# Patient Record
Sex: Male | Born: 1960 | Hispanic: Yes | Marital: Married | Smoking: Never smoker
Health system: Southern US, Community
[De-identification: ages and names within clinical notes are randomized; demographics above are authoritative.]

## PROBLEM LIST (undated history)

## (undated) DIAGNOSIS — K5792 Diverticulitis of intestine, part unspecified, without perforation or abscess without bleeding: Secondary | ICD-10-CM

## (undated) DIAGNOSIS — E7800 Pure hypercholesterolemia, unspecified: Secondary | ICD-10-CM

## (undated) DIAGNOSIS — I10 Essential (primary) hypertension: Secondary | ICD-10-CM

---

## 2005-01-20 ENCOUNTER — Emergency Department: Payer: Self-pay | Admitting: Emergency Medicine

## 2012-10-17 ENCOUNTER — Emergency Department: Payer: Self-pay | Admitting: Emergency Medicine

## 2013-11-04 ENCOUNTER — Emergency Department: Payer: Self-pay | Admitting: Emergency Medicine

## 2013-11-04 LAB — COMPREHENSIVE METABOLIC PANEL
ALK PHOS: 91 U/L
Albumin: 3.9 g/dL (ref 3.4–5.0)
Anion Gap: 2 — ABNORMAL LOW (ref 7–16)
BUN: 10 mg/dL (ref 7–18)
Bilirubin,Total: 0.5 mg/dL (ref 0.2–1.0)
Calcium, Total: 9 mg/dL (ref 8.5–10.1)
Chloride: 105 mmol/L (ref 98–107)
Co2: 28 mmol/L (ref 21–32)
Creatinine: 1.13 mg/dL (ref 0.60–1.30)
Glucose: 112 mg/dL — ABNORMAL HIGH (ref 65–99)
Osmolality: 270 (ref 275–301)
POTASSIUM: 3.8 mmol/L (ref 3.5–5.1)
SGOT(AST): 33 U/L (ref 15–37)
SGPT (ALT): 53 U/L (ref 12–78)
SODIUM: 135 mmol/L — AB (ref 136–145)
Total Protein: 7.9 g/dL (ref 6.4–8.2)

## 2013-11-04 LAB — CBC WITH DIFFERENTIAL/PLATELET
BASOS PCT: 0.3 %
Basophil #: 0 10*3/uL (ref 0.0–0.1)
Eosinophil #: 0.1 10*3/uL (ref 0.0–0.7)
Eosinophil %: 0.9 %
HCT: 47.8 % (ref 40.0–52.0)
HGB: 16.3 g/dL (ref 13.0–18.0)
LYMPHS ABS: 1.6 10*3/uL (ref 1.0–3.6)
Lymphocyte %: 11.8 %
MCH: 29 pg (ref 26.0–34.0)
MCHC: 34.1 g/dL (ref 32.0–36.0)
MCV: 85 fL (ref 80–100)
MONO ABS: 0.9 x10 3/mm (ref 0.2–1.0)
Monocyte %: 6.9 %
Neutrophil #: 11.1 10*3/uL — ABNORMAL HIGH (ref 1.4–6.5)
Neutrophil %: 80.1 %
Platelet: 204 10*3/uL (ref 150–440)
RBC: 5.61 10*6/uL (ref 4.40–5.90)
RDW: 13.4 % (ref 11.5–14.5)
WBC: 13.8 10*3/uL — AB (ref 3.8–10.6)

## 2013-11-04 LAB — URINALYSIS, COMPLETE
Bacteria: NONE SEEN
Bilirubin,UR: NEGATIVE
Blood: NEGATIVE
GLUCOSE, UR: NEGATIVE mg/dL (ref 0–75)
Ketone: NEGATIVE
Leukocyte Esterase: NEGATIVE
Nitrite: NEGATIVE
PH: 6 (ref 4.5–8.0)
Protein: NEGATIVE
RBC,UR: <1 /HPF (ref 0–5)
SPECIFIC GRAVITY: 1.012 (ref 1.003–1.030)
SQUAMOUS EPITHELIAL: NONE SEEN
WBC UR: <1 /HPF (ref 0–5)

## 2013-11-07 ENCOUNTER — Inpatient Hospital Stay: Payer: Self-pay | Admitting: Surgery

## 2013-11-07 LAB — CBC
HCT: 48.8 % (ref 40.0–52.0)
HGB: 16.1 g/dL (ref 13.0–18.0)
MCH: 28.1 pg (ref 26.0–34.0)
MCHC: 33 g/dL (ref 32.0–36.0)
MCV: 85 fL (ref 80–100)
Platelet: 212 10*3/uL (ref 150–440)
RBC: 5.73 10*6/uL (ref 4.40–5.90)
RDW: 13.3 % (ref 11.5–14.5)
WBC: 10.4 10*3/uL (ref 3.8–10.6)

## 2013-11-07 LAB — URINALYSIS, COMPLETE
Bacteria: NONE SEEN
Bilirubin,UR: NEGATIVE
Blood: NEGATIVE
Glucose,UR: NEGATIVE mg/dL (ref 0–75)
Ketone: NEGATIVE
Leukocyte Esterase: NEGATIVE
Nitrite: NEGATIVE
PH: 7 (ref 4.5–8.0)
Protein: NEGATIVE
RBC,UR: 1 /HPF (ref 0–5)
SPECIFIC GRAVITY: 1.012 (ref 1.003–1.030)
Squamous Epithelial: NONE SEEN
WBC UR: <1 /HPF (ref 0–5)

## 2013-11-07 LAB — COMPREHENSIVE METABOLIC PANEL
ALBUMIN: 3.6 g/dL (ref 3.4–5.0)
ANION GAP: 8 (ref 7–16)
Alkaline Phosphatase: 81 U/L
BUN: 9 mg/dL (ref 7–18)
Bilirubin,Total: 0.8 mg/dL (ref 0.2–1.0)
CREATININE: 0.82 mg/dL (ref 0.60–1.30)
Calcium, Total: 9.1 mg/dL (ref 8.5–10.1)
Chloride: 106 mmol/L (ref 98–107)
Co2: 24 mmol/L (ref 21–32)
EGFR (African American): >60
Glucose: 94 mg/dL (ref 65–99)
OSMOLALITY: 274 (ref 275–301)
POTASSIUM: 4.1 mmol/L (ref 3.5–5.1)
SGOT(AST): 39 U/L — ABNORMAL HIGH (ref 15–37)
SGPT (ALT): 45 U/L (ref 12–78)
SODIUM: 138 mmol/L (ref 136–145)
Total Protein: 8.5 g/dL — ABNORMAL HIGH (ref 6.4–8.2)

## 2013-11-07 LAB — LIPASE, BLOOD: Lipase: 104 U/L (ref 73–393)

## 2013-11-08 LAB — CBC WITH DIFFERENTIAL/PLATELET
BASOS PCT: 0.4 %
Basophil #: 0 10*3/uL (ref 0.0–0.1)
EOS ABS: 0.1 10*3/uL (ref 0.0–0.7)
EOS PCT: 1.3 %
HCT: 43.3 % (ref 40.0–52.0)
HGB: 14.6 g/dL (ref 13.0–18.0)
LYMPHS PCT: 25.1 %
Lymphocyte #: 2 10*3/uL (ref 1.0–3.6)
MCH: 28.7 pg (ref 26.0–34.0)
MCHC: 33.6 g/dL (ref 32.0–36.0)
MCV: 85 fL (ref 80–100)
MONO ABS: 0.6 x10 3/mm (ref 0.2–1.0)
MONOS PCT: 7.8 %
NEUTROS PCT: 65.4 %
Neutrophil #: 5.2 10*3/uL (ref 1.4–6.5)
Platelet: 221 10*3/uL (ref 150–440)
RBC: 5.07 10*6/uL (ref 4.40–5.90)
RDW: 13.1 % (ref 11.5–14.5)
WBC: 7.9 10*3/uL (ref 3.8–10.6)

## 2013-11-08 LAB — BASIC METABOLIC PANEL
Anion Gap: 8 (ref 7–16)
BUN: 9 mg/dL (ref 7–18)
CALCIUM: 8.8 mg/dL (ref 8.5–10.1)
CO2: 26 mmol/L (ref 21–32)
CREATININE: 1.09 mg/dL (ref 0.60–1.30)
Chloride: 105 mmol/L (ref 98–107)
EGFR (Non-African Amer.): >60
GLUCOSE: 126 mg/dL — AB (ref 65–99)
OSMOLALITY: 278 (ref 275–301)
POTASSIUM: 3.7 mmol/L (ref 3.5–5.1)
SODIUM: 139 mmol/L (ref 136–145)

## 2014-08-12 NOTE — Consult Note (Signed)
PATIENT NAME:  Jacob Atkins, Jacob Atkins MR#:  [id number] DATE OF BIRTH:  Aug 29, 1960  DATE OF CONSULTATION:  11/09/2013  REFERRING PHYSICIAN:  Dr. Michela PitcherEly. CONSULTING PHYSICIAN:  Jacob Atkins. Jacob Matzek, MD  REASON FOR CONSULTATION: Hypertension.   HISTORY OF PRESENTING ILLNESS: A 54 year old male patient with history of hypertension, not on medications, admitted to the hospital with acute diverticulitis. The patient was initially diagnosed in the Emergency Room on 11/05/2013, with sigmoid diverticulitis. He was started on oral antibiotics and discharged home, but returned because of worsening pain and fever.  Patient was admitted and is doing better at this time on oral antibiotics, and is presently on full liquid diet. His blood pressure has been significantly elevated with the highest being 195/93. The patient mentions that he was diagnosed in the past with hypertension in the Emergency Room during one of his visits last year, but he has not been compliant with the medications.   PAST MEDICAL HISTORY:  Hypertension, not on medications.   SOCIAL HISTORY: The patient does not smoke, very rare alcohol use. Works as a Corporate investment bankerconstruction worker. No illicit drug use.   CODE STATUS: FULL CODE.   FAMILY HISTORY: Mother and father are in [place] and have no medical problems.   ALLERGIES: NO KNOWN DRUG ALLERGIES.   HOME MEDICATIONS: Tylenol p.Atkins.n.   REVIEW OF SYSTEMS: CONSTITUTIONAL: No fever, fatigue, weakness.  EYES: No blurred vision, pain, or redness.  EAR, NOSE, AND THROAT: No tinnitus, ear pain, hearing loss.  RESPIRATORY: No cough, wheezing, hemoptysis.  CARDIOVASCULAR: No chest pain, orthopnea, edema. GASTROINTESTINAL: No nausea, vomiting, diarrhea. Does have left lower quadrant pain. No melena, hematochezia, hematemesis.  GENITOURINARY: No dysuria, hematuria, frequency.  ENDOCRINE: No polyuria, nocturia, thyroid problems.  HEMATOLOGIC AND LYMPHATIC: No anemia, easy bruising, bleeding.  INTEGUMENTARY: No  acne, rash, lesion.  MUSCULOSKELETAL: No back pain, arthritis.  NEUROLOGIC: No focal numbness, weakness, seizure.  PSYCHIATRIC: No anxiety or depression.   PHYSICAL EXAMINATION:  VITAL SIGNS: Temperature 98.3, pulse of 56, respirations 18, blood pressure 173/92, saturating 99% on room air.  GENERAL: Obese Hispanic male patient lying in bed, seems comfortable, conversational, cooperative with exam.  PSYCHIATRIC: Alert and oriented x 3. Mood and affect appropriate. Judgment intact.  HEENT: Atraumatic, normocephalic. Oral mucosa moist and pink. External ears and nose normal. No pallor. No icterus. Pupils bilaterally equal and reactive to light.  NECK: Supple. No thyromegaly or palpable lymph nodes. Trachea midline. No carotid bruit or JVD.  CARDIOVASCULAR: S1, S2, without any murmurs, peripheral pulses 2+. No edema.  RESPIRATORY: Normal work of breathing. Clear to auscultation on both sides.   GASTROINTESTINAL: Soft abdomen. Tenderness in the left lower quadrant on deep palpation. No rigidity or guarding. Bowel sounds present. No hepatosplenomegaly palpable.  SKIN: Warm and dry. No petechiae, does seem to have a small area of bruising on the left lower extremity anteriorly over the shin.  MUSCULOSKELETAL: No joint swelling or redness in large joints. Normal muscle tone.  NEUROLOGICAL: Motor strength 5/5 in upper and lower extremities. Sensation  intact all over.   LYMPHATIC: No cervical lymphadenopathy.   LABORATORY STUDIES: Glucose of 126, BUN 9, creatinine 1.09, sodium 139, potassium 3.7, chloride 105, bicarbonate 26, GFR greater than 60. AST, ALT, alkaline phosphatase and bilirubin normal. WBC 7.9, hemoglobin 14.6, platelets of 221,000.   Urinalysis has no glucose, no protein.   CT scan of the abdomen done recently on 11/05/2013, has been reviewed, shows acute sigmoid diverticulitis.   ASSESSMENT AND PLAN:  1.  Hypertension, uncontrolled.  The patient was diagnosed with hypertension in 2014,  at a ER visit, so was started on lisinopril/hydrochlorothiazide, but has not been compliant with medications. At this point, we will start him on a combination lisinopril/hydrochlorothiazide 20/12.5 once a day. Also, a baby aspirin 81 mg daily. I have discussed with the patient that his blood pressure medication is on the [employer] 4 dollar list. I have also discussed with him regarding long-term complications of uncontrolled hypertension. He needs follow-up in 1-2 weeks for his high blood pressure. If his blood pressure is still uncontrolled, this medication can be made b.i.d. from once a day. We will also add hydralazine IV p.Atkins.n.   2.  Acute diverticulitis, improving with IV antibiotics on full liquid diet.   3.  Elevated fasting blood sugar. The patient has had mildly elevated fasting blood sugars here in the hospital at 126, but he is on D5, which is the cause. There is no glucose in his urinalysis or protein.   4.  Deep vein thrombosis prophylaxis. He is on Lovenox.   Thank you for the consult.   TIME SPENT TODAY ON THIS CONSULT:  40 minutes.   ____________________________ Jacob Bailiff Intisar Claudio, MD srs:ts D: 11/09/2013 11:03:00 ET T: 11/09/2013 11:45:24 ET JOB#: [id number]  cc: Jacob Atkins. Jacob Anis, MD, <Dictator> Quentin Ore III, MD Orie Fisherman MD ELECTRONICALLY SIGNED 11/09/2013 17:24

## 2014-08-12 NOTE — Discharge Summary (Signed)
PATIENT NAME:  Jacob Atkins, Lewin R MR#:  [id number] DATE OF BIRTH:  March 13, 1961  DATE OF ADMISSION:  11/07/2013 DATE OF DISCHARGE:  11/10/2013  BRIEF HISTORY: [name] is a 54 year old gentleman with a history of diverticulitis, previously seen on July 17 in the Emergency Room with what appeared to be sigmoid diverticulitis and possible microperforation. CT scan made the diagnosis. Slightly elevated white blood cell count. He was placed on p.o. antibiotics and sent home. His symptoms began to worsen over the weekend and he was admitted on July 20 with continued abdominal pain, elevated white blood cell count, for failure of outpatient therapy. He was begun on IV antibiotics. He was noted to be significantly hypertensive while in the hospital, and does have a history of untreated hypertension. He was seen by the internal medical service, which assisted in the management of his hypertension and suggested outpatient therapy options. His symptoms improved. He was able to tolerate a regular diet.   He was discharged home on July 23, to be followed in the office in 7 to 10 days' time. Bathing, activity, and driving instructions were given to the patient.   He is to resume his activity level, but not return to work until he follows up in the office.   DISCHARGE MEDICATIONS: Include Keflex 500 mg q.8 h., Cleocin 300 mg q.8 h. He is also discharged home on Percocet 5/325 every 4-6 hours p.r.n.   FINAL DISCHARGE DIAGNOSIS: Perforated diverticulitis.   SURGERY: None.   ____________________________ Carmie Endalph L. Ely III, MD rle:jr D: 11/11/2013 12:20:37 ET T: 11/11/2013 13:35:25 ET JOB#: [id number]  cc: Quentin Orealph L. Ely III, MD, <Dictator> Quentin OreALPH L ELY MD ELECTRONICALLY SIGNED 11/12/2013 18:36

## 2014-08-12 NOTE — H&P (Signed)
PATIENT NAME:  Jacob Atkins, Jacob Atkins MR#:  [id number] DATE OF BIRTH:  July 15, 1960  DATE OF ADMISSION:  11/07/2013  PRIMARY CARE PHYSICIAN: None.   CHIEF COMPLAINT: Abdominal pain.   BRIEF HISTORY: Mr. Jacob Atkins is a 54 year old gentleman seen in the Emergency Room with increasing left lower quadrant abdominal pain. He was evaluated on Friday July 17 in the Emergency Room with abdominal pain, elevated white blood cell count. He had symptoms for a couple of days prior to evaluation and his symptoms had increased. Emergency Room evaluation revealed an elevated white blood cell count. CT scan demonstrated what appeared to be sigmoid diverticulitis with a possible microperforation. He was placed on p.o. antibiotics and was discharged home to follow up in the office this week. Over the weekend he has had increasing abdominal pain, fever and chills, anorexia, diarrhea and increasing pain. He presented back to the Emergency Room this morning.  His white blood cell count is back toward normal. However, with his increasing abdominal symptoms, the surgical service was consulted.   He denies any previous similar symptoms. He has no history of hepatitis, yellow jaundice, pancreatitis, peptic ulcer disease, gallbladder disease or previous diagnosis of diverticulitis. Denies any previous abdominal surgery. He has no history of cardiac disease, diabetes or thyroid problems. He does have a history of hypertension, currently untreated. He is not a cigarette smoker. He does not drink alcohol regularly.   REVIEW OF SYSTEMS:  Completely unremarkable other than the current symptoms noted.   SOCIAL HISTORY:  He is employed.   PHYSICAL EXAMINATION:  VITAL SIGNS:  Blood pressure 174/92, afebrile, heart rate 76 and regular.  HEENT:  Unremarkable. No scleral icterus, no pupillary abnormalities.  NECK:  Supple, nontender with midline trachea. No adenopathy.   CHEST:  Clear with no adventitious sounds. He has normal pulmonary  excursion.  CARDIAC:  No murmurs or gallops to my ear. He seems to be in normal sinus rhythm.  ABDOMEN:  Soft with the exception of the left lower quadrant where he has point tenderness, guarding with no rebound. I cannot palpate a mass in the left lower quadrant.  EXTREMITIES: Lower extremity exam reveals full range of motion, no deformities. Good distal pulses.  PSYCHIATRIC: Normal orientation, normal affect. He appears to have a good understanding of English and his wife assists in the interpretation.   IMPRESSION:  I have independently reviewed his previous CT scan. He does appear to have sigmoid diverticulitis. It is impossible to tell from the films whether he has a microperforation. Because clinically he is worse but has not had any increase in his abdominal findings to suggest the need for surgery we will not repeat his CT scan at this point. We will place him on IV antibiotics and follow his clinical examination. This plan has been discussed with the patient and his wife and they are in agreement.    ____________________________ Carmie Endalph L. Ely III, MD rle:lt D: 11/07/2013 14:46:01 ET T: 11/07/2013 15:35:22 ET JOB#: [id number]  cc: Quentin Orealph L. Ely III, MD, <Dictator> Quentin OreALPH L ELY MD ELECTRONICALLY SIGNED 11/11/2013 7:17

## 2016-08-08 ENCOUNTER — Inpatient Hospital Stay: Payer: Self-pay

## 2016-08-08 ENCOUNTER — Emergency Department: Payer: Self-pay

## 2016-08-08 ENCOUNTER — Inpatient Hospital Stay
Admission: EM | Admit: 2016-08-08 | Discharge: 2016-08-09 | DRG: 153 | Disposition: A | Discharge status: Home / Self Care | Payer: Self-pay | Attending: Internal Medicine | Admitting: Internal Medicine

## 2016-08-08 ENCOUNTER — Encounter: Payer: Self-pay | Admitting: Emergency Medicine

## 2016-08-08 DIAGNOSIS — E785 Hyperlipidemia, unspecified: Secondary | ICD-10-CM

## 2016-08-08 DIAGNOSIS — R739 Hyperglycemia, unspecified: Secondary | ICD-10-CM

## 2016-08-08 DIAGNOSIS — Z72820 Sleep deprivation: Secondary | ICD-10-CM

## 2016-08-08 DIAGNOSIS — J302 Other seasonal allergic rhinitis: Secondary | ICD-10-CM | POA: Diagnosis present

## 2016-08-08 DIAGNOSIS — I639 Cerebral infarction, unspecified: Secondary | ICD-10-CM | POA: Diagnosis present

## 2016-08-08 DIAGNOSIS — I10 Essential (primary) hypertension: Secondary | ICD-10-CM

## 2016-08-08 DIAGNOSIS — Z7982 Long term (current) use of aspirin: Secondary | ICD-10-CM

## 2016-08-08 DIAGNOSIS — Z79899 Other long term (current) drug therapy: Secondary | ICD-10-CM

## 2016-08-08 DIAGNOSIS — R42 Dizziness and giddiness: Secondary | ICD-10-CM

## 2016-08-08 DIAGNOSIS — J019 Acute sinusitis, unspecified: Principal | ICD-10-CM | POA: Diagnosis present

## 2016-08-08 HISTORY — DX: Diverticulitis of intestine, part unspecified, without perforation or abscess without bleeding: K57.92

## 2016-08-08 HISTORY — DX: Pure hypercholesterolemia, unspecified: E78.00

## 2016-08-08 HISTORY — DX: Essential (primary) hypertension: I10

## 2016-08-08 LAB — CBC
HEMATOCRIT: 42 % (ref 40.0–52.0)
Hemoglobin: 14.5 g/dL (ref 13.0–18.0)
MCH: 28.5 pg (ref 26.0–34.0)
MCHC: 34.6 g/dL (ref 32.0–36.0)
MCV: 82.4 fL (ref 80.0–100.0)
PLATELETS: 242 10*3/uL (ref 150–440)
RBC: 5.09 MIL/uL (ref 4.40–5.90)
RDW: 12.9 % (ref 11.5–14.5)
WBC: 10.2 10*3/uL (ref 3.8–10.6)

## 2016-08-08 LAB — TROPONIN I
Troponin I: <0.03 ng/mL (ref <0.03)
Troponin I: <0.03 ng/mL (ref <0.03)

## 2016-08-08 LAB — COMPREHENSIVE METABOLIC PANEL
ALT: 31 U/L (ref 17–63)
ANION GAP: 9 (ref 5–15)
AST: 29 U/L (ref 15–41)
Albumin: 4.7 g/dL (ref 3.5–5.0)
Alkaline Phosphatase: 65 U/L (ref 38–126)
BUN: 22 mg/dL — ABNORMAL HIGH (ref 6–20)
CO2: 25 mmol/L (ref 22–32)
Calcium: 9.5 mg/dL (ref 8.9–10.3)
Chloride: 100 mmol/L — ABNORMAL LOW (ref 101–111)
Creatinine, Ser: 0.96 mg/dL (ref 0.61–1.24)
Glucose, Bld: 145 mg/dL — ABNORMAL HIGH (ref 65–99)
Potassium: 3.7 mmol/L (ref 3.5–5.1)
SODIUM: 134 mmol/L — AB (ref 135–145)
TOTAL PROTEIN: 8.2 g/dL — AB (ref 6.5–8.1)
Total Bilirubin: 0.7 mg/dL (ref 0.3–1.2)

## 2016-08-08 LAB — URINE DRUG SCREEN, QUALITATIVE (ARMC ONLY)
AMPHETAMINES, UR SCREEN: NOT DETECTED
Barbiturates, Ur Screen: NOT DETECTED
Benzodiazepine, Ur Scrn: NOT DETECTED
CANNABINOID 50 NG, UR ~~LOC~~: NOT DETECTED
COCAINE METABOLITE, UR ~~LOC~~: NOT DETECTED
MDMA (ECSTASY) UR SCREEN: NOT DETECTED
METHADONE SCREEN, URINE: NOT DETECTED
Opiate, Ur Screen: NOT DETECTED
Phencyclidine (PCP) Ur S: NOT DETECTED
TRICYCLIC, UR SCREEN: NOT DETECTED

## 2016-08-08 LAB — URINALYSIS, COMPLETE (UACMP) WITH MICROSCOPIC
BACTERIA UA: NONE SEEN
BILIRUBIN URINE: NEGATIVE
GLUCOSE, UA: NEGATIVE mg/dL
HGB URINE DIPSTICK: NEGATIVE
Ketones, ur: 5 mg/dL — AB
LEUKOCYTES UA: NEGATIVE
NITRITE: NEGATIVE
Protein, ur: NEGATIVE mg/dL
SPECIFIC GRAVITY, URINE: 1.02 (ref 1.005–1.030)
SQUAMOUS EPITHELIAL / LPF: NONE SEEN
pH: 6 (ref 5.0–8.0)

## 2016-08-08 LAB — TSH: TSH: 0.612 u[IU]/mL (ref 0.350–4.500)

## 2016-08-08 LAB — PROTIME-INR
INR: 0.94
Prothrombin Time: 12.6 seconds (ref 11.4–15.2)

## 2016-08-08 LAB — APTT: aPTT: 29 seconds (ref 24–36)

## 2016-08-08 LAB — LIPASE, BLOOD: Lipase: 27 U/L (ref 11–51)

## 2016-08-08 MED ORDER — SODIUM CHLORIDE 0.9% FLUSH
3.0000 mL | Freq: Two times a day (BID) | INTRAVENOUS | Status: DC
  Administered 2016-08-08 – 2016-08-09 (×3): 3 mL via INTRAVENOUS

## 2016-08-08 MED ORDER — IOPAMIDOL (ISOVUE-370) INJECTION 76%
75.0000 mL | Freq: Once | INTRAVENOUS | Status: AC | PRN
  Administered 2016-08-08: 75 mL via INTRAVENOUS

## 2016-08-08 MED ORDER — MECLIZINE HCL 25 MG PO TABS: 25.0000 mg | ORAL_TABLET | Freq: Three times a day (TID) | ORAL | Status: DC | PRN

## 2016-08-08 MED ORDER — ONDANSETRON HCL 4 MG/2ML IJ SOLN: 4.0000 mg | Freq: Four times a day (QID) | INTRAMUSCULAR | Status: DC | PRN

## 2016-08-08 MED ORDER — POTASSIUM CHLORIDE IN NACL 20-0.9 MEQ/L-% IV SOLN
INTRAVENOUS | Status: DC
  Administered 2016-08-08 – 2016-08-09 (×2): via INTRAVENOUS
  Filled 2016-08-08 (×4): qty 1000

## 2016-08-08 MED ORDER — ONDANSETRON HCL 4 MG/2ML IJ SOLN
INTRAMUSCULAR | Status: AC
  Administered 2016-08-08: 4 mg via INTRAVENOUS
  Filled 2016-08-08: qty 2

## 2016-08-08 MED ORDER — SODIUM CHLORIDE 0.9 % IV SOLN
1000.0000 mL | Freq: Once | INTRAVENOUS | Status: AC
  Administered 2016-08-08: 1000 mL via INTRAVENOUS

## 2016-08-08 MED ORDER — ENOXAPARIN SODIUM 40 MG/0.4ML ~~LOC~~ SOLN
40.0000 mg | SUBCUTANEOUS | Status: DC
  Administered 2016-08-08: 21:00:00 40 mg via SUBCUTANEOUS
  Filled 2016-08-08: qty 0.4

## 2016-08-08 MED ORDER — ATORVASTATIN CALCIUM 20 MG PO TABS
40.0000 mg | ORAL_TABLET | Freq: Every day | ORAL | Status: DC
  Administered 2016-08-08: 17:00:00 40 mg via ORAL
  Filled 2016-08-08: qty 2

## 2016-08-08 MED ORDER — ONDANSETRON HCL 4 MG PO TABS: 4.0000 mg | ORAL_TABLET | Freq: Four times a day (QID) | ORAL | Status: DC | PRN

## 2016-08-08 MED ORDER — ONDANSETRON HCL 4 MG/2ML IJ SOLN
4.0000 mg | Freq: Once | INTRAMUSCULAR | Status: AC
  Administered 2016-08-08: 4 mg via INTRAVENOUS

## 2016-08-08 MED ORDER — ASPIRIN EC 325 MG PO TBEC: 325.0000 mg | DELAYED_RELEASE_TABLET | Freq: Every day | ORAL | Status: DC

## 2016-08-08 MED ORDER — ASPIRIN 81 MG PO CHEW
324.0000 mg | CHEWABLE_TABLET | Freq: Once | ORAL | Status: AC
  Administered 2016-08-08: 324 mg via ORAL
  Filled 2016-08-08: qty 4

## 2016-08-08 NOTE — ED Triage Notes (Signed)
Pt reports vomiting and dizziness that began at 0100 today. Denies diarrhea. Denies pain.

## 2016-08-08 NOTE — ED Notes (Signed)
[name], ED tech called to transport pt to the floor

## 2016-08-08 NOTE — ED Notes (Signed)
Pt in MRI.

## 2016-08-08 NOTE — ED Notes (Signed)
Pt denies headache at this time to RN. States that he did have a headache this morning when he started throwing up. Pt states that he has hx/o severe headaches

## 2016-08-08 NOTE — Progress Notes (Signed)
Patient's MRI is negative for stroke.  Dr. Winona Legato on the unit and verbal order by Dr. Winona Legato was given to discontinue neuro checks and q 2 hr vitals.  Orson Ape, RN

## 2016-08-08 NOTE — ED Notes (Signed)
Pt unable to give urine sample at this time 

## 2016-08-08 NOTE — H&P (Addendum)
[facility] [place] at [facility]   PATIENT NAME: Jacob Atkins    MR#:  [id number]  DATE OF BIRTH:  01/10/1961  DATE OF ADMISSION:  08/08/2016  PRIMARY CARE PHYSICIAN: [facility]   REQUESTING/REFERRING PHYSICIAN:   CHIEF COMPLAINT:   Chief Complaint  Patient presents with  . Emesis  . Dizziness    HISTORY OF PRESENT ILLNESS: Jacob Atkins  is a 56 y.o. male with a known history of  Hypertension, hyperlipidemia, who presents to ER with complaints of dizziness, some headache, nausea and vomiting, near syncope, CT of head showed small focus in Left centrum semiovale, concerning for acute stroke. The patient denies any swallowing, speech, visual problems, no numbness or weakness in  arms or legs. Hospitalist was consulted for admission.      PAST MEDICAL HISTORY:   Past Medical History:  Diagnosis Date  . Diverticulitis   . Elevated cholesterol   . Hypertension     PAST SURGICAL HISTORY: History reviewed. No pertinent surgical history.  SOCIAL HISTORY:  Social History  Substance Use Topics  . Smoking status: Never Smoker  . Smokeless tobacco: Never Used  . Alcohol use No    FAMILY HISTORY: patient's parents live in [place] and have no medical problems. Marland Kitchen  DRUG ALLERGIES: No Known Allergies  Review of Systems  Eyes: Positive for blurred vision.  Respiratory: Positive for cough and wheezing.   Cardiovascular: Positive for palpitations.  Gastrointestinal: Positive for nausea and vomiting.  Neurological: Positive for dizziness.    MEDICATIONS AT HOME:  Prior to Admission medications   Medication Sig Start Date End Date Taking? Authorizing Provider  lisinopril-hydrochlorothiazide (PRINZIDE,ZESTORETIC) 20-12.5 MG tablet Take 1 tablet by mouth daily. 03/09/14  Yes Historical Provider, MD  lovastatin (MEVACOR) 40 MG tablet Take 40 mg by mouth at bedtime.   Yes Historical Provider, MD      PHYSICAL EXAMINATION:    VITAL SIGNS: Blood pressure 129/77, pulse 80, temperature 97.8 F (36.6 C), temperature source Oral, resp. rate 16, height  (1.651 m), weight 86.2 kg (190 lb), SpO2 100 %.  GENERAL:  56 y.o.-year-old patient lying in the bed with no acute distress.  EYES: Pupils equal, round, reactive to light and accommodation. No scleral icterus. Extraocular muscles intact.  HEENT: Head atraumatic, normocephalic. Oropharynx and nasopharynx clear.  NECK:  Supple, no jugular venous distention. No thyroid enlargement, no tenderness.  LUNGS: Normal breath sounds bilaterally, no wheezing, rales,rhonchi or crepitation. No use of accessory muscles of respiration.  CARDIOVASCULAR: S1, S2 normal. No murmurs, rubs, or gallops.  ABDOMEN: Soft, nontender, nondistended. Bowel sounds present. No organomegaly or mass.  EXTREMITIES: No pedal edema, cyanosis, or clubbing.  NEUROLOGIC: Cranial nerves II through XII revealed mild L facial weakness, forehead is spared, no tongue deviation. Muscle strength 5/5 in all extremities, but LLE, some weaker, although patient argues it's not. Sensation grossly  intact. Gait not checked.  PSYCHIATRIC: The patient is alert and oriented x 3.  SKIN: No obvious rash, lesion, or ulcer.   LABORATORY PANEL:   CBC  Recent Labs Lab 08/08/16 0856  WBC 10.2  HGB 14.5  HCT 42.0  PLT 242  MCV 82.4  MCH 28.5  MCHC 34.6  RDW 12.9   ------------------------------------------------------------------------------------------------------------------  Chemistries   Recent Labs Lab 08/08/16 0856  NA 134*  K 3.7  CL 100*  CO2 25  GLUCOSE 145*  BUN 22*  CREATININE 0.96  CALCIUM 9.5  AST 29  ALT 31  ALKPHOS 65  BILITOT 0.7   ------------------------------------------------------------------------------------------------------------------  Cardiac Enzymes  Recent Labs Lab 08/08/16 0856  TROPONINI <0.03    ------------------------------------------------------------------------------------------------------------------  RADIOLOGY: Ct Angio Head W Or Wo Contrast  Result Date: 08/08/2016 CLINICAL DATA:  Concern for posterior circulation stroke. Dizziness. EXAM: CT ANGIOGRAPHY HEAD AND NECK TECHNIQUE: Multidetector CT imaging of the head and neck was performed using the standard protocol during bolus administration of intravenous contrast. Multiplanar CT image reconstructions and MIPs were obtained to evaluate the vascular anatomy. Carotid stenosis measurements (when applicable) are obtained utilizing NASCET criteria, using the distal internal carotid diameter as the denominator. CONTRAST:  75 mL Isovue 370 IV COMPARISON:  CT head 08/08/2016 FINDINGS: CTA NECK FINDINGS Aortic arch: Normal aortic arch.  Proximal great vessels normal. Right carotid system: Normal right carotid. Negative for atherosclerotic disease or dissection. Left carotid system: Normal left carotid. Negative for atherosclerotic disease or dissection. Vertebral arteries: Both vertebral arteries are normal and widely patent Skeleton: Negative Other neck: Negative for mass or adenopathy Upper chest: Lung apices clear. Review of the MIP images confirms the above findings CTA HEAD FINDINGS Anterior circulation: Mild atherosclerotic calcification in the cavernous carotid bilaterally without stenosis or aneurysm. Anterior and middle cerebral arteries widely patent and normal. Posterior circulation: Both vertebral arteries are normal and patent to the basilar. Basilar widely patent. PICA, superior cerebellar, and posterior cerebral arteries are normal. Venous sinuses: Normal venous enhancement. Anatomic variants: Negative Delayed phase: Normal enhancement on delayed imaging. No change in hypodensity left frontal white matter. Review of the MIP images confirms the above findings IMPRESSION: Negative CTA head and neck. No significant extracranial or  intracranial stenosis. Left frontal white matter hypodensity may represent infarct of indeterminate age. MRI suggested. Electronically Signed   By: Marlan Palau M.D.   On: 08/08/2016 11:23   Ct Head Wo Contrast  Result Date: 08/08/2016 CLINICAL DATA:  Dizziness for several hours EXAM: CT HEAD WITHOUT CONTRAST TECHNIQUE: Contiguous axial images were obtained from the base of the skull through the vertex without intravenous contrast. COMPARISON:  None. FINDINGS: Brain: No findings to suggest acute hemorrhage are identified. In the deep centrum semi ovale on the left there is a rounded area decreased attenuation best seen on image number 20 of series to suspicious for an area of acute to subacute ischemia. Vascular: No hyperdense vessel or unexpected calcification. Skull: Normal. Negative for fracture or focal lesion. Sinuses/Orbits: Mild mucosal changes are noted within the ethmoid and maxillary sinuses. Some fullness in the nasal passages is noted which may be related to polyposis. Other: None. IMPRESSION: Rounded area decreased attenuation in the deep white matter on the left as described suspicious for acute/ subacute ischemia. MRI may be helpful for further evaluation. Mucosal changes in the sinuses of uncertain chronicity. Changes suggestive of nasal polyps. Critical Value/emergent results were called by telephone at the time of interpretation on 08/08/2016 at 9:34 am to Dr. Jene Every , who verbally acknowledged these results. Electronically Signed   By: Alcide Clever M.D.   On: 08/08/2016 09:32   Ct Angio Neck W And/or Wo Contrast  Result Date: 08/08/2016 CLINICAL DATA:  Concern for posterior circulation stroke. Dizziness. EXAM: CT ANGIOGRAPHY HEAD AND NECK TECHNIQUE: Multidetector CT imaging of the head and neck was performed using the standard protocol during bolus administration of intravenous contrast. Multiplanar CT image reconstructions and MIPs were obtained to evaluate the vascular anatomy.  Carotid stenosis measurements (when applicable) are obtained utilizing NASCET criteria, using the distal internal carotid diameter  as the denominator. CONTRAST:  75 mL Isovue 370 IV COMPARISON:  CT head 08/08/2016 FINDINGS: CTA NECK FINDINGS Aortic arch: Normal aortic arch.  Proximal great vessels normal. Right carotid system: Normal right carotid. Negative for atherosclerotic disease or dissection. Left carotid system: Normal left carotid. Negative for atherosclerotic disease or dissection. Vertebral arteries: Both vertebral arteries are normal and widely patent Skeleton: Negative Other neck: Negative for mass or adenopathy Upper chest: Lung apices clear. Review of the MIP images confirms the above findings CTA HEAD FINDINGS Anterior circulation: Mild atherosclerotic calcification in the cavernous carotid bilaterally without stenosis or aneurysm. Anterior and middle cerebral arteries widely patent and normal. Posterior circulation: Both vertebral arteries are normal and patent to the basilar. Basilar widely patent. PICA, superior cerebellar, and posterior cerebral arteries are normal. Venous sinuses: Normal venous enhancement. Anatomic variants: Negative Delayed phase: Normal enhancement on delayed imaging. No change in hypodensity left frontal white matter. Review of the MIP images confirms the above findings IMPRESSION: Negative CTA head and neck. No significant extracranial or intracranial stenosis. Left frontal white matter hypodensity may represent infarct of indeterminate age. MRI suggested. Electronically Signed   By: Marlan Palau M.D.   On: 08/08/2016 11:23    EKG: Orders placed or performed during the hospital encounter of 08/08/16  . ED EKG  . ED EKG  . EKG 12-Lead  . EKG 12-Lead   sinus rhythm at73 bpm , normal axis, no acute STT changes, possible LVH  IMPRESSION AND PLAN:  Active Problems:   Acute CVA (cerebrovascular accident) (HCC)   Dizziness   Hyperglycemia   CVA (cerebral  vascular accident) (HCC)   Essential hypertension   Hyperlipidemia   #1. Acute stroke,, admit patient to the medical floor, initiate him on aspirin, change Mevacor to Lipitor, get brain MRI, echo, neurology consultation,SLP evaluations, Hgb a1c, TSH #2 dizziness, supportive therapy, meclizine prn, PT #3. Hyperglycemia, get Hgb a1c #4 essential HTN, hold BP meds for now #5  hyperlipidemia, lipid panel in am, lipitor  All the records are reviewed and case discussed with ED provider. Management plans discussed with the patient, family and they are in agreement.  CODE STATUS: Code Status History    This patient does not have a recorded code status. Please follow your organizational policy for patients in this situation.       TOTAL TIME TAKING CARE OF THIS PATIENT: 50 minutes.    Katharina Caper M.D on 08/08/2016 at 12:43 PM  Between 7am to 6pm - Pager - [phone] After 6pm go to [email or web address] - password [email or web address] [facility]  [facility]  Office  [phone]  CC: Primary care physician; [facility]

## 2016-08-08 NOTE — Consult Note (Addendum)
Referring Physician: Cyril Loosen    Chief Complaint: Dizziness  HPI: Jacob Atkins is an 56 y.o. male with a history of HTN who presents with acute onset dizziness.  Patient reports that he was at work and was being raised on a platform.  Had acute onset of dizziness.  Describes as the room spinning.  Became nauseous and vomited as well.  Presented for evaluation.  Head CT abnormal.  Initial NIHSS of 0.  Date last known well: Date: 08/08/2016 Time last known well: Time: 01:00 tPA Given: No: Outside time window  Past Medical History:  Diagnosis Date  . Diverticulitis   . Elevated cholesterol   . Hypertension     History reviewed. No pertinent surgical history.  Family history: Both parents deceased. Unclear reasons why and no information about their medical history.   Social History:  reports that he has never smoked. He has never used smokeless tobacco. He reports that he does not drink alcohol. His drug history is not on file.  Allergies: No Known Allergies  Medications: I have reviewed the patient's current medications. Prior to Admission:  Prior to Admission medications   Medication Sig Start Date End Date Taking? Authorizing Provider  lisinopril-hydrochlorothiazide (PRINZIDE,ZESTORETIC) 20-12.5 MG tablet Take 1 tablet by mouth daily. 03/09/14  Yes Historical Provider, MD  lovastatin (MEVACOR) 40 MG tablet Take 40 mg by mouth at bedtime.   Yes Historical Provider, MD    ROS: History obtained from the patient  General ROS: negative for - chills, fatigue, fever, night sweats, weight gain or weight loss Psychological ROS: negative for - behavioral disorder, hallucinations, memory difficulties, mood swings or suicidal ideation Ophthalmic ROS: negative for - blurry vision, double vision, eye pain or loss of vision ENT ROS: negative for - epistaxis, nasal discharge, oral lesions, sore throat, tinnitus Allergy and Immunology ROS: negative for - hives or itchy/watery  eyes Hematological and Lymphatic ROS: negative for - bleeding problems, bruising or swollen lymph nodes Endocrine ROS: negative for - galactorrhea, hair pattern changes, polydipsia/polyuria or temperature intolerance Respiratory ROS: cough Cardiovascular ROS: negative for - chest pain, dyspnea on exertion, edema or irregular heartbeat Gastrointestinal ROS: as noted in HPI Genito-Urinary ROS: negative for - dysuria, hematuria, incontinence or urinary frequency/urgency Musculoskeletal ROS: negative for - joint swelling or muscular weakness Neurological ROS: as noted in HPI Dermatological ROS: negative for rash and skin lesion changes  Physical Examination: Blood pressure 129/77, pulse 80, temperature 97.8 F (36.6 C), temperature source Oral, resp. rate 16, height  (1.651 m), weight 86.2 kg (190 lb), SpO2 100 %.  HEENT-  Normocephalic, no lesions, without obvious abnormality.  Normal external eye and conjunctiva.  Normal TM's bilaterally.  Normal auditory canals and external ears. Normal external nose, mucus membranes and septum.  Normal pharynx. Cardiovascular- S1, S2 normal, pulses palpable throughout   Lungs- chest clear, no wheezing, rales, normal symmetric air entry Abdomen- soft, non-tender; bowel sounds normal; no masses,  no organomegaly Extremities- no edema Lymph-no adenopathy palpable Musculoskeletal-no joint tenderness, deformity or swelling Skin-warm and dry, no hyperpigmentation, vitiligo, or suspicious lesions  Neurological Examination   Mental Status: Alert, oriented, thought content appropriate.  Speech fluent without evidence of aphasia.  Able to follow 3 step commands without difficulty. Cranial Nerves: II: Discs flat bilaterally; Visual fields grossly normal, pupils equal, round, reactive to light and accommodation III,IV, VI: ptosis not present, extra-ocular motions intact bilaterally V,VII: smile symmetric, facial light touch sensation normal bilaterally VIII:  hearing normal bilaterally IX,X: gag reflex  present XI: bilateral shoulder shrug XII: midline tongue extension Motor: Right : Upper extremity   5/5    Left:     Upper extremity   5/5  Lower extremity   5/5     Lower extremity   5/5 Tone and bulk:normal tone throughout; no atrophy noted Sensory: Pinprick and light touch intact throughout, bilaterally Deep Tendon Reflexes: 2+ and symmetric throughout Plantars: Right: mute   Left: mute Cerebellar: Normal finger-to-nose and normal heel-to-shin testing bilaterally Gait: not tested due to safety concerns    Laboratory Studies:  Basic Metabolic Panel:  Recent Labs Lab 08/08/16 0856  NA 134*  K 3.7  CL 100*  CO2 25  GLUCOSE 145*  BUN 22*  CREATININE 0.96  CALCIUM 9.5    Liver Function Tests:  Recent Labs Lab 08/08/16 0856  AST 29  ALT 31  ALKPHOS 65  BILITOT 0.7  PROT 8.2*  ALBUMIN 4.7    Recent Labs Lab 08/08/16 0856  LIPASE 27   No results for input(s): AMMONIA in the last 168 hours.  CBC:  Recent Labs Lab 08/08/16 0856  WBC 10.2  HGB 14.5  HCT 42.0  MCV 82.4  PLT 242    Cardiac Enzymes:  Recent Labs Lab 08/08/16 0856  TROPONINI <0.03    BNP: Invalid input(s): POCBNP  CBG: No results for input(s): GLUCAP in the last 168 hours.  Microbiology: No results found for this or any previous visit.  Coagulation Studies:  Recent Labs  08/08/16 0956  LABPROT 12.6  INR 0.94    Urinalysis:  Recent Labs Lab 08/08/16 0856  COLORURINE YELLOW*  LABSPEC 1.020  PHURINE 6.0  GLUCOSEU NEGATIVE  HGBUR NEGATIVE  BILIRUBINUR NEGATIVE  KETONESUR 5*  PROTEINUR NEGATIVE  NITRITE NEGATIVE  LEUKOCYTESUR NEGATIVE    Lipid Panel: No results found for: CHOL, TRIG, HDL, CHOLHDL, VLDL, LDLCALC  HgbA1C: No results found for: HGBA1C  Urine Drug Screen:     Component Value Date/Time   LABOPIA NONE DETECTED 08/08/2016 0856   COCAINSCRNUR NONE DETECTED 08/08/2016 0856   LABBENZ NONE DETECTED  08/08/2016 0856   AMPHETMU NONE DETECTED 08/08/2016 0856   THCU NONE DETECTED 08/08/2016 0856   LABBARB NONE DETECTED 08/08/2016 0856    Alcohol Level: No results for input(s): ETH in the last 168 hours.  Other results: EKG: sinus rhythm at 73 bpm.  Imaging: Ct Angio Head W Or Wo Contrast  Result Date: 08/08/2016 CLINICAL DATA:  Concern for posterior circulation stroke. Dizziness. EXAM: CT ANGIOGRAPHY HEAD AND NECK TECHNIQUE: Multidetector CT imaging of the head and neck was performed using the standard protocol during bolus administration of intravenous contrast. Multiplanar CT image reconstructions and MIPs were obtained to evaluate the vascular anatomy. Carotid stenosis measurements (when applicable) are obtained utilizing NASCET criteria, using the distal internal carotid diameter as the denominator. CONTRAST:  75 mL Isovue 370 IV COMPARISON:  CT head 08/08/2016 FINDINGS: CTA NECK FINDINGS Aortic arch: Normal aortic arch.  Proximal great vessels normal. Right carotid system: Normal right carotid. Negative for atherosclerotic disease or dissection. Left carotid system: Normal left carotid. Negative for atherosclerotic disease or dissection. Vertebral arteries: Both vertebral arteries are normal and widely patent Skeleton: Negative Other neck: Negative for mass or adenopathy Upper chest: Lung apices clear. Review of the MIP images confirms the above findings CTA HEAD FINDINGS Anterior circulation: Mild atherosclerotic calcification in the cavernous carotid bilaterally without stenosis or aneurysm. Anterior and middle cerebral arteries widely patent and normal. Posterior circulation: Both vertebral arteries are  normal and patent to the basilar. Basilar widely patent. PICA, superior cerebellar, and posterior cerebral arteries are normal. Venous sinuses: Normal venous enhancement. Anatomic variants: Negative Delayed phase: Normal enhancement on delayed imaging. No change in hypodensity left frontal white  matter. Review of the MIP images confirms the above findings IMPRESSION: Negative CTA head and neck. No significant extracranial or intracranial stenosis. Left frontal white matter hypodensity may represent infarct of indeterminate age. MRI suggested. Electronically Signed   By: Marlan Palau M.D.   On: 08/08/2016 11:23   Ct Head Wo Contrast  Result Date: 08/08/2016 CLINICAL DATA:  Dizziness for several hours EXAM: CT HEAD WITHOUT CONTRAST TECHNIQUE: Contiguous axial images were obtained from the base of the skull through the vertex without intravenous contrast. COMPARISON:  None. FINDINGS: Brain: No findings to suggest acute hemorrhage are identified. In the deep centrum semi ovale on the left there is a rounded area decreased attenuation best seen on image number 20 of series to suspicious for an area of acute to subacute ischemia. Vascular: No hyperdense vessel or unexpected calcification. Skull: Normal. Negative for fracture or focal lesion. Sinuses/Orbits: Mild mucosal changes are noted within the ethmoid and maxillary sinuses. Some fullness in the nasal passages is noted which may be related to polyposis. Other: None. IMPRESSION: Rounded area decreased attenuation in the deep white matter on the left as described suspicious for acute/ subacute ischemia. MRI may be helpful for further evaluation. Mucosal changes in the sinuses of uncertain chronicity. Changes suggestive of nasal polyps. Critical Value/emergent results were called by telephone at the time of interpretation on 08/08/2016 at 9:34 am to Dr. Jene Every , who verbally acknowledged these results. Electronically Signed   By: Alcide Clever M.D.   On: 08/08/2016 09:32   Ct Angio Neck W And/or Wo Contrast  Result Date: 08/08/2016 CLINICAL DATA:  Concern for posterior circulation stroke. Dizziness. EXAM: CT ANGIOGRAPHY HEAD AND NECK TECHNIQUE: Multidetector CT imaging of the head and neck was performed using the standard protocol during bolus  administration of intravenous contrast. Multiplanar CT image reconstructions and MIPs were obtained to evaluate the vascular anatomy. Carotid stenosis measurements (when applicable) are obtained utilizing NASCET criteria, using the distal internal carotid diameter as the denominator. CONTRAST:  75 mL Isovue 370 IV COMPARISON:  CT head 08/08/2016 FINDINGS: CTA NECK FINDINGS Aortic arch: Normal aortic arch.  Proximal great vessels normal. Right carotid system: Normal right carotid. Negative for atherosclerotic disease or dissection. Left carotid system: Normal left carotid. Negative for atherosclerotic disease or dissection. Vertebral arteries: Both vertebral arteries are normal and widely patent Skeleton: Negative Other neck: Negative for mass or adenopathy Upper chest: Lung apices clear. Review of the MIP images confirms the above findings CTA HEAD FINDINGS Anterior circulation: Mild atherosclerotic calcification in the cavernous carotid bilaterally without stenosis or aneurysm. Anterior and middle cerebral arteries widely patent and normal. Posterior circulation: Both vertebral arteries are normal and patent to the basilar. Basilar widely patent. PICA, superior cerebellar, and posterior cerebral arteries are normal. Venous sinuses: Normal venous enhancement. Anatomic variants: Negative Delayed phase: Normal enhancement on delayed imaging. No change in hypodensity left frontal white matter. Review of the MIP images confirms the above findings IMPRESSION: Negative CTA head and neck. No significant extracranial or intracranial stenosis. Left frontal white matter hypodensity may represent infarct of indeterminate age. MRI suggested. Electronically Signed   By: Marlan Palau M.D.   On: 08/08/2016 11:23    Assessment: 56 y.o. male with a history of HTN who  presents with acute onset dizziness.  Head CT reviewed and shows an area of hypodensity in the left frontal white matter.  Etiology unclear and unclear if related  to current presentation.  CTA unremarkable.  Further work up recommended.    Stroke Risk Factors - hypertension  Plan: 1. HgbA1c, fasting lipid panel 2. MRI of the brain without contrast.  Would not initiate stroke work up unless indicative of an acute infarct.  At that time would consider, PT consult, OT consult, Speech consult, echocardiogram. 3. Prophylactic therapy-Antiplatelet med: Aspirin - dose  daily 4. Telemetry monitoring 5. Frequent neuro checks 6. Vestibular exercises    Thana Farr, MD Neurology [phone] 08/08/2016, 12:11 PM

## 2016-08-08 NOTE — ED Provider Notes (Signed)
[facility] Emergency Department Provider Note   ____________________________________________    I have reviewed the triage vital signs and the nursing notes.   HISTORY  Chief Complaint Emesis and Dizziness     HPI Jacob Atkins is a 56 y.o. male who presents with complaints of nausea and dizziness. 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 He reports at approximately 5 PM he had eggs for dinner and felt well until approximately 1 AM at which time he became dizzy and nauseated and vomited. He denies abdominal pain. He does report he had a headache while vomiting but that seems to have improved. No fevers or chills. No sick contacts. No recent travel. He has never had this before.   Past Medical History:  Diagnosis Date  . Diverticulitis   . Elevated cholesterol   . Hypertension     There are no active problems to display for this patient.   History reviewed. No pertinent surgical history.  Prior to Admission medications   Medication Sig Start Date End Date Taking? Authorizing Provider  lisinopril-hydrochlorothiazide (PRINZIDE,ZESTORETIC) 20-12.5 MG tablet Take 1 tablet by mouth daily. 03/09/14  Yes Historical Provider, MD  lovastatin (MEVACOR) 40 MG tablet Take 40 mg by mouth at bedtime.   Yes Historical Provider, MD     Allergies Patient has no known allergies.  No family history on file.  Social History Social History  Substance Use Topics  . Smoking status: Never Smoker  . Smokeless tobacco: Never Used  . Alcohol use No    Review of Systems  Constitutional: No fever/chills Eyes: No visual changes.  ENT: No sore throat. Cardiovascular: Denies chest pain. Respiratory: Denies Cough Gastrointestinal: As above Genitourinary: Negative for dysuria. Musculoskeletal: Negative for back pain. Skin: Negative for rash. Neurological: Negative for focal weakness  10-point ROS otherwise  negative.  ____________________________________________   PHYSICAL EXAM:  VITAL SIGNS: ED Triage Vitals  Enc Vitals Group     BP 08/08/16 0830 137/69     Pulse Rate 08/08/16 0830 79     Resp 08/08/16 0830 18     Temp 08/08/16 0830 97.8 F (36.6 C)     Temp Source 08/08/16 0830 Oral     SpO2 08/08/16 0830 100 %     Weight 08/08/16 0831 190 lb (86.2 kg)     Height 08/08/16 0831  (1.651 m)     Head Circumference --      Peak Flow --      Pain Score --      Pain Loc --      Pain Edu? --      Excl. in GC? --     Constitutional: Alert and oriented. No acute distress. Pleasant and interactive Eyes: Conjunctivae are normal. PERRLA, EOMI Head: Atraumatic.  Mouth/Throat: Mucous membranes are moist.   Neck:  Painless ROM Cardiovascular: Normal rate, regular rhythm. Grossly normal heart sounds.  Good peripheral circulation. Respiratory: Normal respiratory effort.  No retractions. Lungs CTAB. Gastrointestinal: Soft and nontender. No distention.  No CVA tenderness. Genitourinary: deferred Musculoskeletal: No lower extremity tenderness nor edema.  Warm and well perfused Neurologic:  Normal speech and language. No gross focal neurologic deficits are appreciated.  Skin:  Skin is warm, dry and intact. No rash noted. Psychiatric: Mood and affect are normal. Speech and behavior are normal.  ____________________________________________   LABS (all labs ordered are listed, but only abnormal results are displayed)  Labs Reviewed  COMPREHENSIVE METABOLIC PANEL - Abnormal; Notable for  the following:       Result Value   Sodium 134 (*)    Chloride 100 (*)    Glucose, Bld 145 (*)    BUN 22 (*)    Total Protein 8.2 (*)    All other components within normal limits  URINALYSIS, COMPLETE (UACMP) WITH MICROSCOPIC - Abnormal; Notable for the following:    Color, Urine YELLOW (*)    APPearance CLEAR (*)    Ketones, ur 5 (*)    All other components within normal limits  LIPASE, BLOOD   CBC  TROPONIN I  APTT  PROTIME-INR  URINE DRUG SCREEN, QUALITATIVE ([facility] ONLY)   ____________________________________________  EKG  ED ECG REPORT I, Jene Every, the attending physician, personally viewed and interpreted this ECG.  Date: 08/08/2016  Rate:73 Rhythm: normal sinus rhythm QRS Axis: normal Intervals: normal ST/T Wave abnormalities: normal Conduction Disturbances: none Narrative Interpretation: unremarkable  ____________________________________________  RADIOLOGY  Called by radiologist re: CT ____________________________________________   PROCEDURES  Procedure(s) performed: No    Critical Care performed: No ____________________________________________   INITIAL IMPRESSION / ASSESSMENT AND PLAN / ED COURSE  Pertinent labs & imaging results that were available during my care of the patient were reviewed by me and considered in my medical decision making (see chart for details).  Patient presents with nausea vomiting and dizziness. He has no abdominal pain. He has not had diarrhea yet. High prevalence of gastroenteritis in the community at this time, we will treat with Zofran and IV fluids as well as send labs obtain CT head and EKG and reevaluate  ----------------------------------------- 9:50 AM on 08/08/2016 -----------------------------------------  Contacted by radiologist and informed of area of decreased attenuation in the deep Centrum semi ovale, recommends MRI.   Discussed with Dr. Thad Ranger of neurology, given symptoms she recommends CTA of the head and neck in the emergency department  ----------------------------------------- 12:01 PM on 08/08/2016 -----------------------------------------  CTA of the head has resulted, I will admit to the hospitalist service for further workup ____________________________________________   FINAL CLINICAL IMPRESSION(S) / ED DIAGNOSES  Final diagnoses:  Cerebrovascular accident (CVA),  unspecified mechanism (HCC)      NEW MEDICATIONS STARTED DURING THIS VISIT:  New Prescriptions   No medications on file     Note:  This document was prepared using Dragon voice recognition software and may include unintentional dictation errors.    Jene Every, MD 08/08/16 1201

## 2016-08-08 NOTE — ED Notes (Signed)
Pt ambulatory to room.

## 2016-08-09 LAB — BASIC METABOLIC PANEL
ANION GAP: 5 (ref 5–15)
BUN: 17 mg/dL (ref 6–20)
CO2: 25 mmol/L (ref 22–32)
Calcium: 8.6 mg/dL — ABNORMAL LOW (ref 8.9–10.3)
Chloride: 108 mmol/L (ref 101–111)
Creatinine, Ser: 0.84 mg/dL (ref 0.61–1.24)
GFR calc Af Amer: >60 mL/min (ref >60)
Glucose, Bld: 113 mg/dL — ABNORMAL HIGH (ref 65–99)
POTASSIUM: 4 mmol/L (ref 3.5–5.1)
SODIUM: 138 mmol/L (ref 135–145)

## 2016-08-09 LAB — CBC
HCT: 39.7 % — ABNORMAL LOW (ref 40.0–52.0)
HEMOGLOBIN: 13.4 g/dL (ref 13.0–18.0)
MCH: 28.2 pg (ref 26.0–34.0)
MCHC: 33.7 g/dL (ref 32.0–36.0)
MCV: 83.5 fL (ref 80.0–100.0)
Platelets: 239 10*3/uL (ref 150–440)
RBC: 4.75 MIL/uL (ref 4.40–5.90)
RDW: 13.3 % (ref 11.5–14.5)
WBC: 7.9 10*3/uL (ref 3.8–10.6)

## 2016-08-09 LAB — LIPID PANEL
CHOL/HDL RATIO: 3.8 ratio
CHOLESTEROL: 134 mg/dL (ref 0–200)
HDL: 35 mg/dL — AB (ref >40)
LDL Cholesterol: 73 mg/dL (ref 0–99)
TRIGLYCERIDES: 129 mg/dL (ref <150)
VLDL: 26 mg/dL (ref 0–40)

## 2016-08-09 LAB — HIV ANTIBODY (ROUTINE TESTING W REFLEX): HIV Screen 4th Generation wRfx: NONREACTIVE

## 2016-08-09 LAB — GLUCOSE, CAPILLARY: Glucose-Capillary: 103 mg/dL — ABNORMAL HIGH (ref 65–99)

## 2016-08-09 LAB — TROPONIN I

## 2016-08-09 LAB — HEMOGLOBIN A1C
Hgb A1c MFr Bld: 5.9 % — ABNORMAL HIGH (ref 4.8–5.6)
MEAN PLASMA GLUCOSE: 123 mg/dL

## 2016-08-09 MED ORDER — ASPIRIN 81 MG PO CHEW
81.0000 mg | CHEWABLE_TABLET | Freq: Every day | ORAL | Status: DC
  Administered 2016-08-09: 09:00:00 81 mg via ORAL
  Filled 2016-08-09: qty 1

## 2016-08-09 MED ORDER — MECLIZINE HCL 25 MG PO TABS: 25.0000 mg | ORAL_TABLET | Freq: Three times a day (TID) | ORAL | Status: DC | PRN

## 2016-08-09 MED ORDER — ASPIRIN 81 MG PO CHEW: 81.0000 mg | CHEWABLE_TABLET | Freq: Every day | ORAL | 0 refills | Status: AC

## 2016-08-09 MED ORDER — LORATADINE 10 MG PO TABS: 10.0000 mg | ORAL_TABLET | Freq: Every day | ORAL | 0 refills | Status: AC

## 2016-08-09 MED ORDER — MECLIZINE HCL 25 MG PO TABS: 25.0000 mg | ORAL_TABLET | Freq: Three times a day (TID) | ORAL | Status: DC

## 2016-08-09 MED ORDER — LORATADINE 10 MG PO TABS
10.0000 mg | ORAL_TABLET | Freq: Every day | ORAL | Status: DC
  Administered 2016-08-09: 09:00:00 10 mg via ORAL
  Filled 2016-08-09: qty 1

## 2016-08-09 NOTE — Progress Notes (Signed)
Pt being discharged home, discharge instructions reviewed with pt and wife, states understanding, pt with no complaints, refuses wheelchair at discharge

## 2016-08-09 NOTE — Progress Notes (Signed)
SLP Cancellation Note  Patient Details Name: Jacob Atkins MRN: [id number] DOB: [birth year]   Cancelled treatment:       Reason Eval/Treat Not Completed: SLP screened, no needs identified, will sign off (chart reviewed; NSG consulted; pt)  NSG to reconsult if any change in status while admitted.   Jerilynn Som, MS, CCC-SLP Watson,Katherine 08/09/2016, 10:04 AM

## 2016-08-09 NOTE — Discharge Instructions (Signed)
Patient advised to follow with Neurology if his symptoms do not improve or keeps recurring

## 2016-08-09 NOTE — Discharge Summary (Signed)
[facility] - [place] at [facility]   PATIENT NAME: Jacob Atkins    MR#:  [id number]  DATE OF BIRTH:  1960/07/27  DATE OF ADMISSION:  08/08/2016 ADMITTING PHYSICIAN: Katharina Caper, MD  DATE OF DISCHARGE: 08/09/16  PRIMARY CARE PHYSICIAN: [facility]    ADMISSION DIAGNOSIS:  Cerebral infarction [facility]) [I63.9] CVA (cerebral infarction) [I63.9] Cerebrovascular accident (CVA), unspecified mechanism (HCC) [I63.9]  DISCHARGE DIAGNOSIS:  Sinusitis with dizziness Seasonal allergies HTN HL  SECONDARY DIAGNOSIS:   Past Medical History:  Diagnosis Date  . Diverticulitis   . Elevated cholesterol   . Hypertension     HOSPITAL COURSE:   Melchor Kirchgessner  is a 56 y.o. male with a known history of  Hypertension, hyperlipidemia, who presents to ER with complaints of dizziness, some headache, nausea and vomiting, near syncope, CT of head showed small focus in Left centrum semiovale, concerning for acute stroke  #1.Dizziness due to sinusitis seasonal allergies and sleep deprivation - on aspirin -cont statins -MRI neg for CVA. Area of white matter changes is nonspecific on MRI likley vascular dz Pt advised to see neurology as out pt if recurrent symptoms -seen by Neurology  #2 dizziness, supportive therapy, meclizine prn  #3 essential HTN Resume  BP meds  #4  hyperlipidemia Cont statins  D/c home spoke with wife CONSULTS OBTAINED:  Treatment Team:  Kym Groom, MD Thana Farr, MD  DRUG ALLERGIES:  No Known Allergies  DISCHARGE MEDICATIONS:   Current Discharge Medication List    START taking these medications   Details  aspirin 81 MG chewable tablet Chew 1 tablet (81 mg total) by mouth daily. Qty: 30 tablet, Refills: 0    loratadine (CLARITIN) 10 MG tablet Take 1 tablet (10 mg total) by mouth daily. Qty: 30 tablet, Refills: 0      CONTINUE these medications which have NOT CHANGED   Details   lisinopril-hydrochlorothiazide (PRINZIDE,ZESTORETIC) 20-12.5 MG tablet Take 1 tablet by mouth daily.    lovastatin (MEVACOR) 40 MG tablet Take 40 mg by mouth at bedtime.        If you experience worsening of your admission symptoms, develop shortness of breath, life threatening emergency, suicidal or homicidal thoughts you must seek medical attention immediately by calling 911 or calling your MD immediately  if symptoms less severe.  You Must read complete instructions/literature along with all the possible adverse reactions/side effects for all the Medicines you take and that have been prescribed to you. Take any new Medicines after you have completely understood and accept all the possible adverse reactions/side effects.   Please note  You were cared for by a hospitalist during your hospital stay. If you have any questions about your discharge medications or the care you received while you were in the hospital after you are discharged, you can call the unit and asked to speak with the hospitalist on call if the hospitalist that took care of you is not available. Once you are discharged, your primary care physician will handle any further medical issues. Please note that NO REFILLS for any discharge medications will be authorized once you are discharged, as it is imperative that you return to your primary care physician (or establish a relationship with a primary care physician if you do not have one) for your aftercare needs so that they can reassess your need for medications and monitor your lab values. Today   SUBJECTIVE   Feels better  VITAL SIGNS:  Blood pressure 116/66, pulse 62, temperature  97.6 F (36.4 C), temperature source Oral, resp. rate 18, height  (1.651 m), weight 83.3 kg (183 lb 11.2 oz), SpO2 97 %.  I/O:   Intake/Output Summary (Last 24 hours) at 08/09/16 0801 Last data filed at 08/08/16 2111  Gross per 24 hour  Intake             1605 ml  Output                 0 ml  Net             1605 ml    PHYSICAL EXAMINATION:  GENERAL:  56 y.o.-year-old patient lying in the bed with no acute distress.  EYES: Pupils equal, round, reactive to light and accommodation. No scleral icterus. Extraocular muscles intact.  HEENT: Head atraumatic, normocephalic. Oropharynx and nasopharynx clear.  NECK:  Supple, no jugular venous distention. No thyroid enlargement, no tenderness.  LUNGS: Normal breath sounds bilaterally, no wheezing, rales,rhonchi or crepitation. No use of accessory muscles of respiration.  CARDIOVASCULAR: S1, S2 normal. No murmurs, rubs, or gallops.  ABDOMEN: Soft, non-tender, non-distended. Bowel sounds present. No organomegaly or mass.  EXTREMITIES: No pedal edema, cyanosis, or clubbing.  NEUROLOGIC: Cranial nerves II through XII are intact. Muscle strength 5/5 in all extremities. Sensation intact. Gait not checked.  PSYCHIATRIC: The patient is alert and oriented x 3.  SKIN: No obvious rash, lesion, or ulcer.   DATA REVIEW:   CBC   Recent Labs Lab 08/09/16 0312  WBC 7.9  HGB 13.4  HCT 39.7*  PLT 239    Chemistries   Recent Labs Lab 08/08/16 0856 08/09/16 0312  NA 134* 138  K 3.7 4.0  CL 100* 108  CO2 25 25  GLUCOSE 145* 113*  BUN 22* 17  CREATININE 0.96 0.84  CALCIUM 9.5 8.6*  AST 29  --   ALT 31  --   ALKPHOS 65  --   BILITOT 0.7  --     Microbiology Results   No results found for this or any previous visit (from the past 240 hour(s)).  RADIOLOGY:  Ct Angio Head W Or Wo Contrast  Result Date: 08/08/2016 CLINICAL DATA:  Concern for posterior circulation stroke. Dizziness. EXAM: CT ANGIOGRAPHY HEAD AND NECK TECHNIQUE: Multidetector CT imaging of the head and neck was performed using the standard protocol during bolus administration of intravenous contrast. Multiplanar CT image reconstructions and MIPs were obtained to evaluate the vascular anatomy. Carotid stenosis measurements (when applicable) are obtained utilizing  NASCET criteria, using the distal internal carotid diameter as the denominator. CONTRAST:  75 mL Isovue 370 IV COMPARISON:  CT head 08/08/2016 FINDINGS: CTA NECK FINDINGS Aortic arch: Normal aortic arch.  Proximal great vessels normal. Right carotid system: Normal right carotid. Negative for atherosclerotic disease or dissection. Left carotid system: Normal left carotid. Negative for atherosclerotic disease or dissection. Vertebral arteries: Both vertebral arteries are normal and widely patent Skeleton: Negative Other neck: Negative for mass or adenopathy Upper chest: Lung apices clear. Review of the MIP images confirms the above findings CTA HEAD FINDINGS Anterior circulation: Mild atherosclerotic calcification in the cavernous carotid bilaterally without stenosis or aneurysm. Anterior and middle cerebral arteries widely patent and normal. Posterior circulation: Both vertebral arteries are normal and patent to the basilar. Basilar widely patent. PICA, superior cerebellar, and posterior cerebral arteries are normal. Venous sinuses: Normal venous enhancement. Anatomic variants: Negative Delayed phase: Normal enhancement on delayed imaging. No change in hypodensity left frontal white matter. Review of  the MIP images confirms the above findings IMPRESSION: Negative CTA head and neck. No significant extracranial or intracranial stenosis. Left frontal white matter hypodensity may represent infarct of indeterminate age. MRI suggested. Electronically Signed   By: Marlan Palau M.D.   On: 08/08/2016 11:23   Ct Head Wo Contrast  Result Date: 08/08/2016 CLINICAL DATA:  Dizziness for several hours EXAM: CT HEAD WITHOUT CONTRAST TECHNIQUE: Contiguous axial images were obtained from the base of the skull through the vertex without intravenous contrast. COMPARISON:  None. FINDINGS: Brain: No findings to suggest acute hemorrhage are identified. In the deep centrum semi ovale on the left there is a rounded area decreased  attenuation best seen on image number 20 of series to suspicious for an area of acute to subacute ischemia. Vascular: No hyperdense vessel or unexpected calcification. Skull: Normal. Negative for fracture or focal lesion. Sinuses/Orbits: Mild mucosal changes are noted within the ethmoid and maxillary sinuses. Some fullness in the nasal passages is noted which may be related to polyposis. Other: None. IMPRESSION: Rounded area decreased attenuation in the deep white matter on the left as described suspicious for acute/ subacute ischemia. MRI may be helpful for further evaluation. Mucosal changes in the sinuses of uncertain chronicity. Changes suggestive of nasal polyps. Critical Value/emergent results were called by telephone at the time of interpretation on 08/08/2016 at 9:34 am to Dr. Jene Every , who verbally acknowledged these results. Electronically Signed   By: Alcide Clever M.D.   On: 08/08/2016 09:32   Ct Angio Neck W And/or Wo Contrast  Result Date: 08/08/2016 CLINICAL DATA:  Concern for posterior circulation stroke. Dizziness. EXAM: CT ANGIOGRAPHY HEAD AND NECK TECHNIQUE: Multidetector CT imaging of the head and neck was performed using the standard protocol during bolus administration of intravenous contrast. Multiplanar CT image reconstructions and MIPs were obtained to evaluate the vascular anatomy. Carotid stenosis measurements (when applicable) are obtained utilizing NASCET criteria, using the distal internal carotid diameter as the denominator. CONTRAST:  75 mL Isovue 370 IV COMPARISON:  CT head 08/08/2016 FINDINGS: CTA NECK FINDINGS Aortic arch: Normal aortic arch.  Proximal great vessels normal. Right carotid system: Normal right carotid. Negative for atherosclerotic disease or dissection. Left carotid system: Normal left carotid. Negative for atherosclerotic disease or dissection. Vertebral arteries: Both vertebral arteries are normal and widely patent Skeleton: Negative Other neck: Negative  for mass or adenopathy Upper chest: Lung apices clear. Review of the MIP images confirms the above findings CTA HEAD FINDINGS Anterior circulation: Mild atherosclerotic calcification in the cavernous carotid bilaterally without stenosis or aneurysm. Anterior and middle cerebral arteries widely patent and normal. Posterior circulation: Both vertebral arteries are normal and patent to the basilar. Basilar widely patent. PICA, superior cerebellar, and posterior cerebral arteries are normal. Venous sinuses: Normal venous enhancement. Anatomic variants: Negative Delayed phase: Normal enhancement on delayed imaging. No change in hypodensity left frontal white matter. Review of the MIP images confirms the above findings IMPRESSION: Negative CTA head and neck. No significant extracranial or intracranial stenosis. Left frontal white matter hypodensity may represent infarct of indeterminate age. MRI suggested. Electronically Signed   By: Marlan Palau M.D.   On: 08/08/2016 11:23   Mr Brain Wo Contrast  Result Date: 08/08/2016 CLINICAL DATA:  Dizziness and headache.  Cerebral infarction EXAM: MRI HEAD WITHOUT CONTRAST TECHNIQUE: Multiplanar, multiecho pulse sequences of the brain and surrounding structures were obtained without intravenous contrast. COMPARISON:  Head CT and CTA from earlier today FINDINGS: Brain: Left frontal white matter low  density on previous CT is the largest of multiple FLAIR signal abnormalities in the bilateral cerebral white matter. These are distributed from the ventricular margin to the juxta cortical white matter. No infratentorial signal abnormalities or discrete lacune. No acute infarct, hemorrhage, or hydrocephalus. No mass effect or extra-axial collection. Brain volume is normal. Partially empty sella, incidental in this clinical setting. Vascular: Recent CTA. Normal flow voids in the dural venous sinuses. Skull and upper cervical spine: Negative Sinuses/Orbits: Mucosal thickening  throughout the paranasal sinuses with trapped secretions and nasal cavity polypoid structures. IMPRESSION: 1. No acute finding, including infarct. 2. Left frontal white matter low density on previous head CT is the largest of multiple white matter signal abnormalities. These are nonspecific but could be from chronic small vessel ischemia given patient's vascular risk factors. Correlate for demyelinating or inflammatory features given patient's young age. 3. Extensive sinusitis with polypoid features in the nasal cavity. Electronically Signed   By: Marnee Spring M.D.   On: 08/08/2016 15:06     Management plans discussed with the patient, family and they are in agreement.  CODE STATUS:     Code Status Orders        Start     Ordered   08/08/16 1507  Full code  Continuous     08/08/16 1506    Code Status History    Date Active Date Inactive Code Status Order ID Comments User Context   This patient has a current code status but no historical code status.      TOTAL TIME TAKING CARE OF THIS PATIENT: *40* minutes.    Amahd Morino M.D on 08/09/2016 at 8:01 AM  Between 7am to 6pm - Pager - [phone] After 6pm go to [email or web address] - Social research officer, government  [facility]  Office  [phone]  CC: Primary care physician; [facility]

## 2018-09-22 IMAGING — MR MR HEAD W/O CM
10 series · 48 of 48 positions shown · non-contrast
Comparison: Head CT and CTA from earlier today

CLINICAL DATA: Dizziness and headache.  Cerebral infarction

EXAM:
MRI HEAD WITHOUT CONTRAST
TECHNIQUE: Multiplanar, multiecho pulse sequences of the brain and surrounding
structures were obtained without intravenous contrast.

[Series 2: T1 · sagittal · 5.0mm · 0.45mm/px · 3 of 25 slices shown (1 of 2)]
[im 1/25]
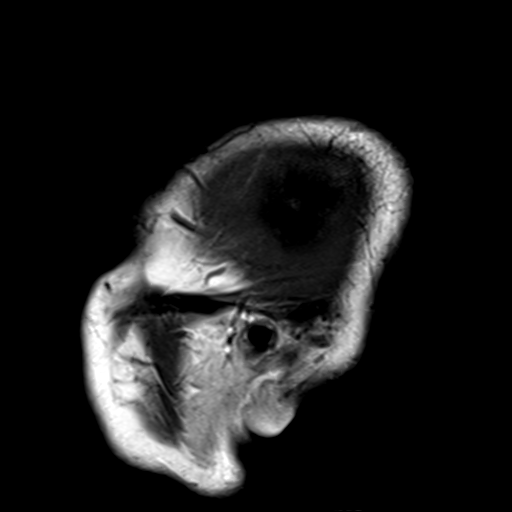
[im 13/25]
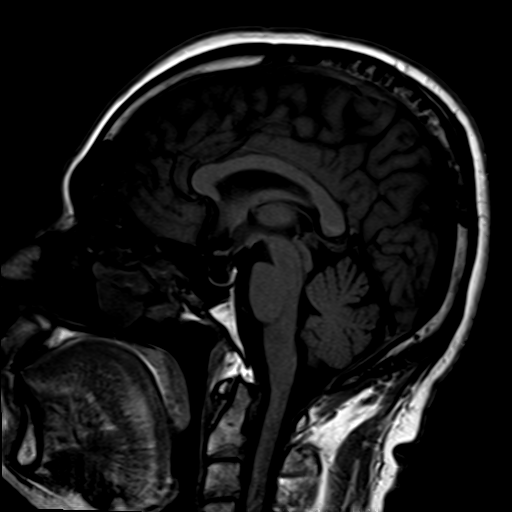
[im 25/25]
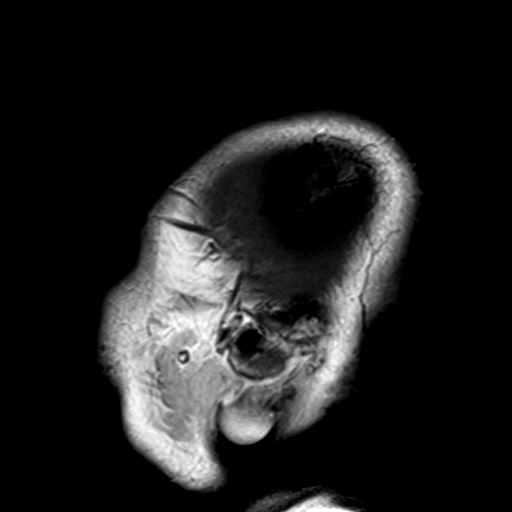

[Series 4: DWI · axial · 3.0mm · 1.80mm/px · z∈[-43,+110]mm · 5 of 53 slices shown (1 of 2)]
[im 1/53]
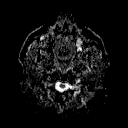
[im 14/53]
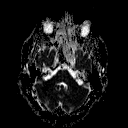
[im 27/53]
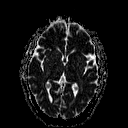
[im 40/53]
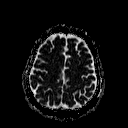
[im 53/53]
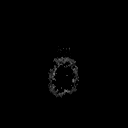

[Series 6: DWI · coronal · 3.0mm · 1.80mm/px · 4 of 45 slices shown (2 of 2)]
[im 1/45]
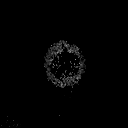
[im 15/45]
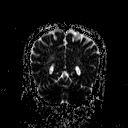
[im 30/45]
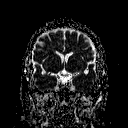
[im 45/45]
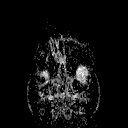

[Series 7: T2 · axial · 5.0mm · 0.60mm/px · z∈[-40,+108]mm · 2 of 25 slices shown (1 of 3)]
[im 1/25]
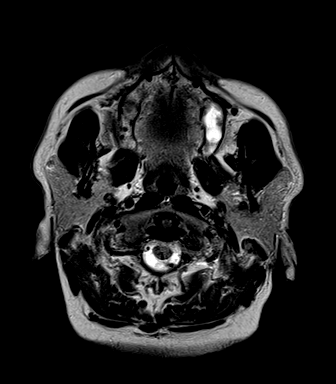
[im 25/25]
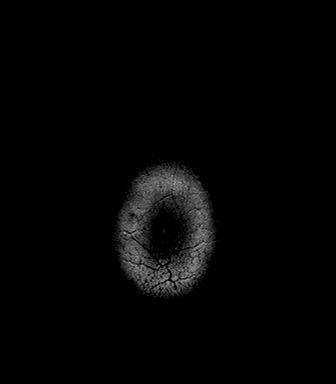

[Series 8: FLAIR · axial · 3.0mm · 0.45mm/px · z∈[-40,+108]mm · 5 of 53 slices shown]
[im 1/53]
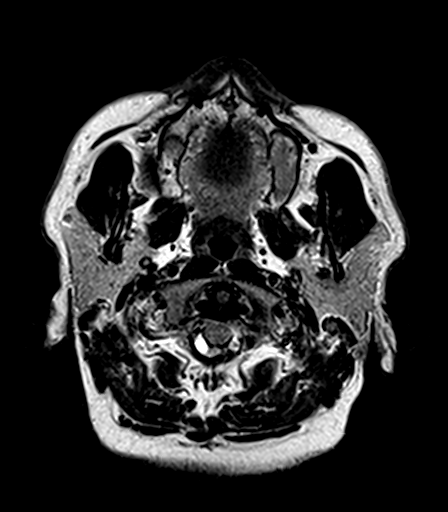
[im 14/53]
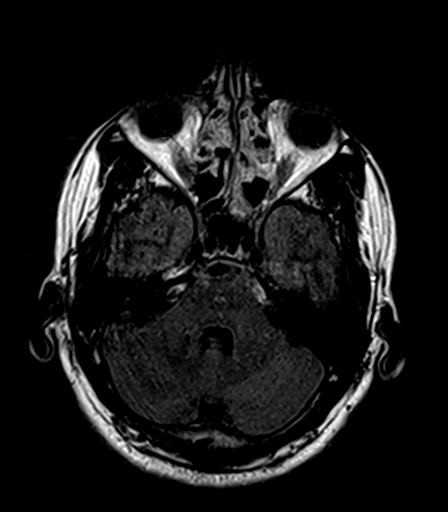
[im 27/53]
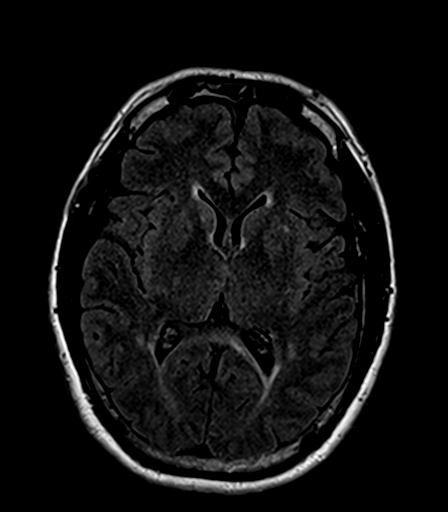
[im 40/53]
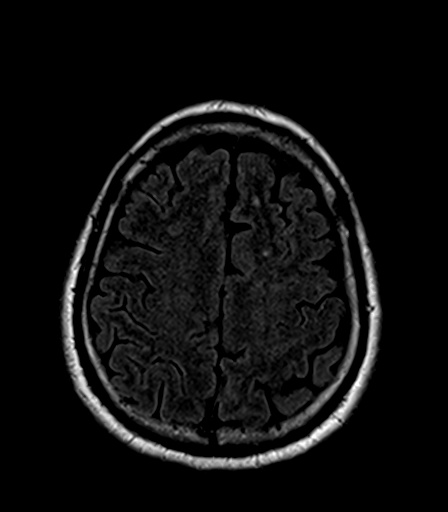
[im 53/53]
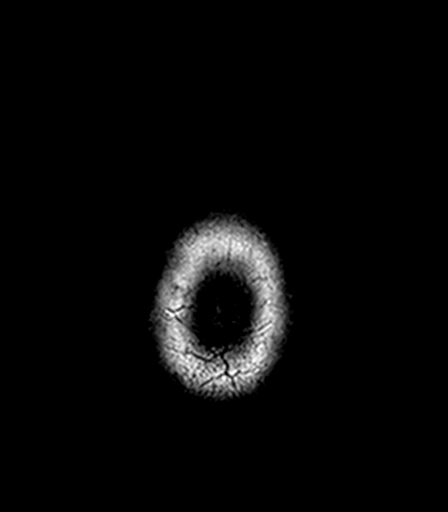

[Series 9: T1 · axial · 1.0mm · 1.00mm/px · z∈[-44,+121]mm · 16 of 176 slices shown (2 of 2)]
[im 1/176]
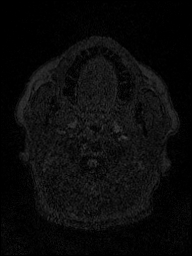
[im 12/176]
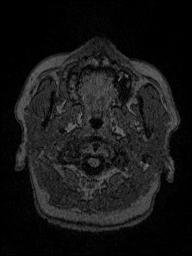
[im 24/176]
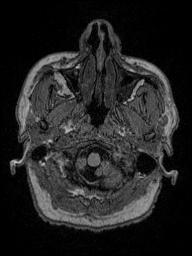
[im 36/176]
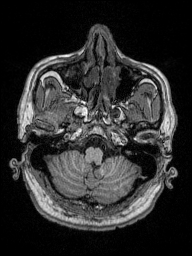
[im 47/176]
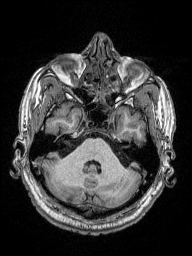
[im 59/176]
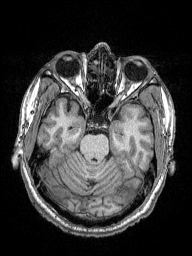
[im 71/176]
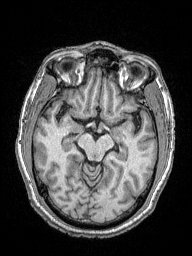
[im 82/176]
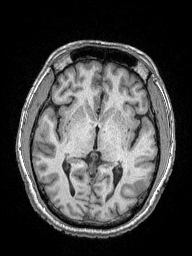
[im 94/176]
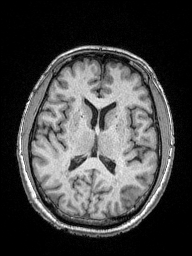
[im 106/176]
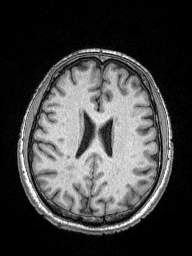
[im 117/176]
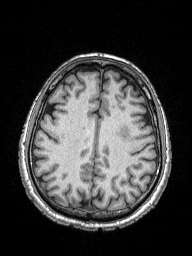
[im 129/176]
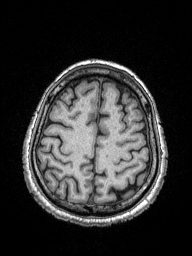
[im 141/176]
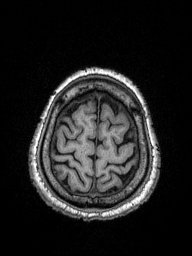
[im 152/176]
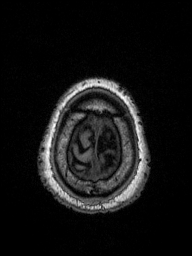
[im 164/176]
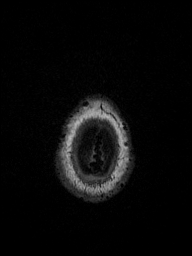
[im 176/176]
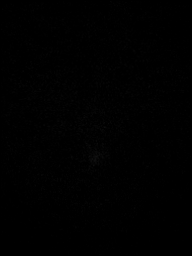

[Series 10: T2 · axial · 5.0mm · 0.45mm/px · z∈[-40,+108]mm · 2 of 25 slices shown (2 of 3)]
[im 1/25]
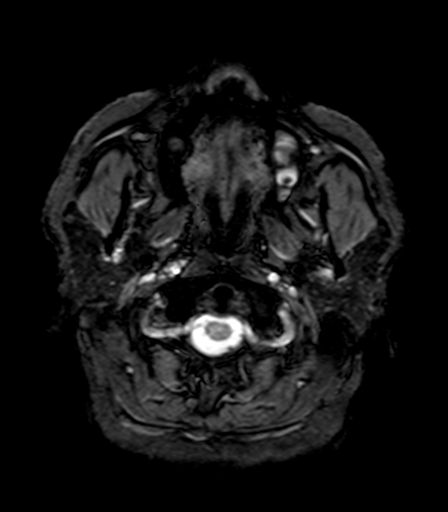
[im 25/25]
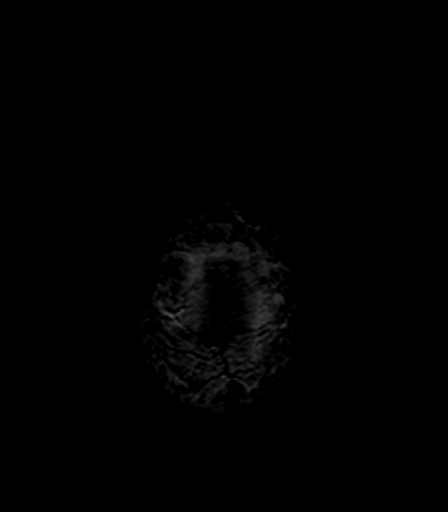

[Series 11: T2 · coronal · 5.0mm · 0.49mm/px · 2 of 27 slices shown (3 of 3)]
[im 1/27]
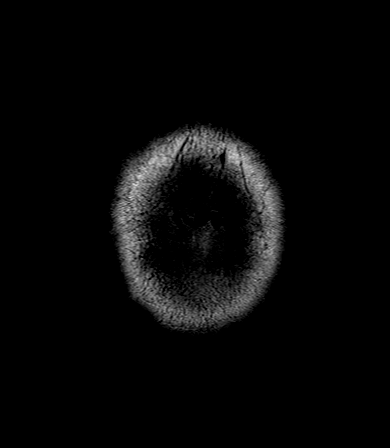
[im 27/27]
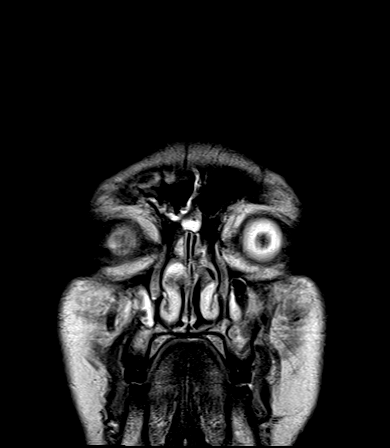

[Series 100: ax (id) · axial · 3.0mm · 1.80mm/px · z∈[-43,+110]mm · 5 of 55 slices shown]
[im 1/55]
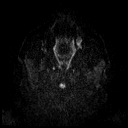
[im 14/55]
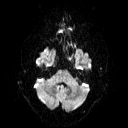
[im 28/55]
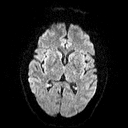
[im 41/55]
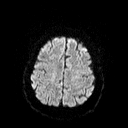
[im 55/55]
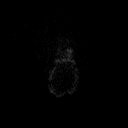

[Series 101: cor (id) · coronal · 3.0mm · 1.80mm/px · 4 of 45 slices shown]
[im 1/45]
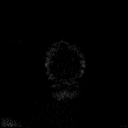
[im 15/45]
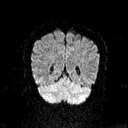
[im 30/45]
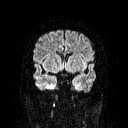
[im 45/45]
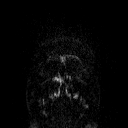

[48 of 48 positions shown; findings below may reference images not displayed]

FINDINGS: Brain: Left frontal white matter low density on previous CT is the
largest of multiple FLAIR signal abnormalities in the bilateral
cerebral white matter. These are distributed from the ventricular
margin to the juxta cortical white matter. No infratentorial signal
abnormalities or discrete lacune. No acute infarct, hemorrhage, or
hydrocephalus. No mass effect or extra-axial collection. Brain
volume is normal.

Partially empty sella, incidental in this clinical setting.

Vascular: Recent CTA. Normal flow voids in the dural venous sinuses.

Skull and upper cervical spine: Negative

Sinuses/Orbits: Mucosal thickening throughout the paranasal sinuses
with trapped secretions and nasal cavity polypoid structures.
IMPRESSION: 1. No acute finding, including infarct.
2. Left frontal white matter low density on previous head CT is the
largest of multiple white matter signal abnormalities. These are
nonspecific but could be from chronic small vessel ischemia given
patient's vascular risk factors. Correlate for demyelinating or
inflammatory features given patient's young age.
3. Extensive sinusitis with polypoid features in the nasal cavity.

## 2023-02-22 ENCOUNTER — Ambulatory Visit: Payer: Self-pay

## 2023-02-22 ENCOUNTER — Emergency Department
Admission: EM | Admit: 2023-02-22 | Discharge: 2023-02-22 | Disposition: A | Discharge status: Home / Self Care | Payer: Self-pay | Attending: Emergency Medicine | Admitting: Emergency Medicine

## 2023-02-22 ENCOUNTER — Encounter: Payer: Self-pay | Admitting: Emergency Medicine

## 2023-02-22 ENCOUNTER — Other Ambulatory Visit: Payer: Self-pay

## 2023-02-22 DIAGNOSIS — I10 Essential (primary) hypertension: Secondary | ICD-10-CM | POA: Insufficient documentation

## 2023-02-22 DIAGNOSIS — L03114 Cellulitis of left upper limb: Secondary | ICD-10-CM | POA: Insufficient documentation

## 2023-02-22 DIAGNOSIS — L249 Irritant contact dermatitis, unspecified cause: Secondary | ICD-10-CM | POA: Insufficient documentation

## 2023-02-22 LAB — COMPREHENSIVE METABOLIC PANEL
ALT: 26 U/L (ref 0–44)
AST: 23 U/L (ref 15–41)
Albumin: 4.5 g/dL (ref 3.5–5.0)
Alkaline Phosphatase: 66 U/L (ref 38–126)
Anion gap: 7 (ref 5–15)
BUN: 19 mg/dL (ref 8–23)
CO2: 27 mmol/L (ref 22–32)
Calcium: 9.6 mg/dL (ref 8.9–10.3)
Chloride: 103 mmol/L (ref 98–111)
Creatinine, Ser: 0.8 mg/dL (ref 0.61–1.24)
GFR, Estimated: >60 mL/min (ref >60)
Glucose, Bld: 135 mg/dL — ABNORMAL HIGH (ref 70–99)
Potassium: 3.8 mmol/L (ref 3.5–5.1)
Sodium: 137 mmol/L (ref 135–145)
Total Bilirubin: 0.8 mg/dL (ref 0.3–1.2)
Total Protein: 8 g/dL (ref 6.5–8.1)

## 2023-02-22 LAB — CBC
HCT: 44.8 % (ref 39.0–52.0)
Hemoglobin: 15.1 g/dL (ref 13.0–17.0)
MCH: 28.8 pg (ref 26.0–34.0)
MCHC: 33.7 g/dL (ref 30.0–36.0)
MCV: 85.5 fL (ref 80.0–100.0)
Platelets: 229 10*3/uL (ref 150–400)
RBC: 5.24 MIL/uL (ref 4.22–5.81)
RDW: 12.9 % (ref 11.5–15.5)
WBC: 8.1 10*3/uL (ref 4.0–10.5)
nRBC: 0 % (ref 0.0–0.2)

## 2023-02-22 MED ORDER — CEPHALEXIN 500 MG PO CAPS: 500.0000 mg | ORAL_CAPSULE | Freq: Four times a day (QID) | ORAL | 0 refills | Status: AC

## 2023-02-22 MED ORDER — DOXYCYCLINE MONOHYDRATE 100 MG PO TABS: 100.0000 mg | ORAL_TABLET | Freq: Two times a day (BID) | ORAL | 0 refills | Status: AC

## 2023-02-22 NOTE — ED Provider Notes (Signed)
[facility] Provider Note    Event Date/Time   First MD Initiated Contact with Patient 02/22/23 [phone]     (approximate)   History   Rash   HPI  Jacob Atkins is a 62 y.o. male who presents for evaluation of rash to his left arm.  Patient reports that approximately 1 week ago he burned it on the metal part of a torch and did not seek medical attention at that time.  He has been applying a lotion/cream to the area, he is unsure what it has been, but over the past few days he has developed redness and small bumps that are mostly itchy.  No fevers or chills.  He is still able to move his arm normally.  Patient Active Problem List   Diagnosis Date Noted   Acute CVA (cerebrovascular accident) (HCC) 08/08/2016   Dizziness 08/08/2016   Hyperglycemia 08/08/2016   CVA (cerebral vascular accident) (HCC) 08/08/2016   Essential hypertension 08/08/2016   Hyperlipidemia 08/08/2016          Physical Exam   Triage Vital Signs: ED Triage Vitals  Encounter Vitals Group     BP 02/22/23 0905 (!) 152/78     Systolic BP Percentile --      Diastolic BP Percentile --      Pulse Rate 02/22/23 0905 82     Resp 02/22/23 0905 15     Temp 02/22/23 0905 98.6 F (37 C)     Temp Source 02/22/23 0905 Oral     SpO2 02/22/23 0905 100 %     Weight 02/22/23 0903 198 lb (89.8 kg)     Height 02/22/23 0903 5\' 3"  (1.6 m)     Head Circumference --      Peak Flow --      Pain Score 02/22/23 0903 7     Pain Loc --      Pain Education --      Exclude from Growth Chart --     Most recent vital signs: Vitals:   02/22/23 0905  BP: (!) 152/78  Pulse: 82  Resp: 15  Temp: 98.6 F (37 C)  SpO2: 100%    Physical Exam Vitals and nursing note reviewed.  Constitutional:      General: Awake and alert. No acute distress.    Appearance: Normal appearance. The patient is normal weight.  HENT:     Head: Normocephalic and atraumatic.     Mouth: Mucous membranes are moist.   Eyes:     General: PERRL. Normal EOMs        Right eye: No discharge.        Left eye: No discharge.     Conjunctiva/sclera: Conjunctivae normal.  Cardiovascular:     Rate and Rhythm: Normal rate and regular rhythm.     Pulses: Normal pulses.  Pulmonary:     Effort: Pulmonary effort is normal. No respiratory distress.     Breath sounds: Normal breath sounds.  Abdominal:     Abdomen is soft. There is no abdominal tenderness. No rebound or guarding. No distention. Musculoskeletal:        General: No swelling. Normal range of motion.     Cervical back: Normal range of motion and neck supple.  Skin:    General: Skin is warm and dry.     Capillary Refill: Capillary refill takes less than 2 seconds.     Findings: Left distal dorsal forearm with 1 x 1 cm circular superficial  abrasion with surrounding erythema involving the dorsum only extending proximally to mid forearm.  There are small papules noted.  No lymphangitis.  No crepitus.  No discharge.  No swelling.  Full and normal range of motion of shoulder, elbow, wrist.  Normal radial pulse.  Compartment soft compressible throughout. Neurological:     Mental Status: The patient is awake and alert.      ED Results / Procedures / Treatments   Labs (all labs ordered are listed, but only abnormal results are displayed) Labs Reviewed  COMPREHENSIVE METABOLIC PANEL - Abnormal; Notable for the following components:      Result Value   Glucose, Bld 135 (*)    All other components within normal limits  CBC     EKG     RADIOLOGY     PROCEDURES:  Critical Care performed:   Procedures   MEDICATIONS ORDERED IN ED: Medications - No data to display   IMPRESSION / MDM / ASSESSMENT AND PLAN / ED COURSE  I reviewed the triage vital signs and the nursing notes.   Differential diagnosis includes, but is not limited to, contact dermatitis, cellulitis, burn.  Patient is awake and alert, hemodynamically stable and afebrile.   Labs are obtained in triage and are overall reassuring.  Patient has a small circular superficial abrasion from the burn, with surrounding.  There are some papules and patient reports that it is very itchy, and he has been a cream/lotion to it though he is unsure what it is called.  It is quite possible that he has developed a contact dermatitis to that and I recommended that he stop using that cream.   It is also possible that he is developing cellulitis. No crepitus or bullae or hemodynamic instability or pain out of proportion to indicate deep space infection. No fluctuance to suggest cystic lesion such as abscess. No fevers or constitutional symptoms to suggest systemic infection.  Physical exam does not show any evidence of joint involvement. No lymphangitis. Patient will initiate oral antibiotics and follow-up closely as an outpatient. Return if worsening or developing new symptoms in which case may require IV antibiotics. Discussed care plan, return precautions, and advised close outpatient follow-up. Patient agrees with plan of care.   Patient's presentation is most consistent with acute complicated illness / injury requiring diagnostic workup.    FINAL CLINICAL IMPRESSION(S) / ED DIAGNOSES   Final diagnoses:  Irritant contact dermatitis, unspecified trigger  Cellulitis of left upper extremity     Rx / DC Orders   ED Discharge Orders          Ordered    cephALEXin (KEFLEX) 500 MG capsule  4 times daily        02/22/23 0931    doxycycline (ADOXA) 100 MG tablet  2 times daily        02/22/23 0931             Note:  This document was prepared using Dragon voice recognition software and may include unintentional dictation errors.   Jackelyn Hoehn, PA-C 02/22/23 0957    Janith Lima, MD 02/22/23 1201

## 2023-02-22 NOTE — ED Triage Notes (Signed)
Pt states 2 weeks ago he burn his left wrist with a torch at work and 1 week ago a rash started. Area is red with bumps. Pt reports not going to the hospital after the burn.

## 2023-02-22 NOTE — Discharge Instructions (Addendum)
Please stop applying lotion to the area.  Use the antibiotics as prescribed.  Please return for any new, worsening, or change in symptoms or other concerns.  It was a pleasure caring for you today.
